# Patient Record
Sex: Female | Born: 1980 | Race: White | Hispanic: No | Marital: Single
Health system: Southern US, Community
[De-identification: ages and names within clinical notes are randomized; demographics above are authoritative.]

## PROBLEM LIST (undated history)

## (undated) DIAGNOSIS — D229 Melanocytic nevi, unspecified: Secondary | ICD-10-CM

## (undated) HISTORY — DX: Melanocytic nevi, unspecified: D22.9

---

## 2005-01-20 ENCOUNTER — Ambulatory Visit: Payer: Self-pay | Admitting: Family Medicine

## 2005-01-20 ENCOUNTER — Inpatient Hospital Stay: Payer: Self-pay | Admitting: Surgery

## 2011-10-06 ENCOUNTER — Ambulatory Visit: Payer: Self-pay | Admitting: Podiatry

## 2013-05-26 ENCOUNTER — Ambulatory Visit: Payer: Self-pay | Admitting: Unknown Physician Specialty

## 2014-09-30 DIAGNOSIS — Z30011 Encounter for initial prescription of contraceptive pills: Secondary | ICD-10-CM | POA: Insufficient documentation

## 2018-11-03 DIAGNOSIS — C4491 Basal cell carcinoma of skin, unspecified: Secondary | ICD-10-CM

## 2018-11-03 HISTORY — DX: Basal cell carcinoma of skin, unspecified: C44.91

## 2019-08-18 HISTORY — PX: BREAST BIOPSY: SHX20

## 2020-08-09 ENCOUNTER — Other Ambulatory Visit: Payer: Self-pay | Admitting: Family Medicine

## 2020-08-09 DIAGNOSIS — N6310 Unspecified lump in the right breast, unspecified quadrant: Secondary | ICD-10-CM

## 2020-08-12 ENCOUNTER — Other Ambulatory Visit: Payer: Self-pay

## 2020-08-12 ENCOUNTER — Ambulatory Visit
Admission: RE | Admit: 2020-08-12 | Discharge: 2020-08-12 | Disposition: A | Discharge status: Home / Self Care | Payer: Commercial Managed Care - PPO | Source: Ambulatory Visit | Attending: Family Medicine | Admitting: Family Medicine

## 2020-08-12 DIAGNOSIS — N6310 Unspecified lump in the right breast, unspecified quadrant: Secondary | ICD-10-CM

## 2020-08-16 ENCOUNTER — Other Ambulatory Visit: Payer: Self-pay | Admitting: Family Medicine

## 2020-08-16 DIAGNOSIS — N6001 Solitary cyst of right breast: Secondary | ICD-10-CM

## 2020-08-16 DIAGNOSIS — R928 Other abnormal and inconclusive findings on diagnostic imaging of breast: Secondary | ICD-10-CM

## 2020-08-17 ENCOUNTER — Other Ambulatory Visit: Payer: Self-pay

## 2020-08-17 ENCOUNTER — Ambulatory Visit
Admission: RE | Admit: 2020-08-17 | Discharge: 2020-08-17 | Disposition: A | Discharge status: Home / Self Care | Payer: Commercial Managed Care - PPO | Source: Ambulatory Visit | Attending: Family Medicine | Admitting: Family Medicine

## 2020-08-17 ENCOUNTER — Other Ambulatory Visit: Payer: Self-pay | Admitting: Family Medicine

## 2020-08-17 DIAGNOSIS — N6001 Solitary cyst of right breast: Secondary | ICD-10-CM

## 2020-08-17 DIAGNOSIS — R928 Other abnormal and inconclusive findings on diagnostic imaging of breast: Secondary | ICD-10-CM

## 2020-08-18 ENCOUNTER — Other Ambulatory Visit: Payer: Self-pay

## 2020-08-18 LAB — SURGICAL PATHOLOGY

## 2021-02-01 ENCOUNTER — Ambulatory Visit (INDEPENDENT_AMBULATORY_CARE_PROVIDER_SITE_OTHER): Payer: Commercial Managed Care - PPO

## 2021-02-01 ENCOUNTER — Other Ambulatory Visit: Payer: Self-pay | Admitting: Podiatry

## 2021-02-01 ENCOUNTER — Other Ambulatory Visit: Payer: Self-pay

## 2021-02-01 ENCOUNTER — Ambulatory Visit: Payer: Commercial Managed Care - PPO | Admitting: Podiatry

## 2021-02-01 DIAGNOSIS — M76822 Posterior tibial tendinitis, left leg: Secondary | ICD-10-CM | POA: Diagnosis not present

## 2021-02-01 DIAGNOSIS — N939 Abnormal uterine and vaginal bleeding, unspecified: Secondary | ICD-10-CM | POA: Insufficient documentation

## 2021-02-01 DIAGNOSIS — R1032 Left lower quadrant pain: Secondary | ICD-10-CM | POA: Insufficient documentation

## 2021-02-01 DIAGNOSIS — M7672 Peroneal tendinitis, left leg: Secondary | ICD-10-CM

## 2021-02-01 DIAGNOSIS — M778 Other enthesopathies, not elsewhere classified: Secondary | ICD-10-CM

## 2021-02-01 DIAGNOSIS — R102 Pelvic and perineal pain: Secondary | ICD-10-CM | POA: Insufficient documentation

## 2021-02-01 MED ORDER — MELOXICAM 15 MG PO TABS: 15.0000 mg | ORAL_TABLET | Freq: Every day | ORAL | 3 refills | Status: DC

## 2021-02-01 NOTE — Patient Instructions (Signed)
Posterior Tibial Tendinitis  Posterior tibial tendinitis is irritation of a tendon called the posterior tibial tendon. Your posterior tibial tendon is a cord-like tissue that connects bones of your lower leg and foot to a muscle that: 1. Supports your arch. 2. Helps you raise up on your toes. 3. Helps you turn your foot down and in. This condition causes foot and ankle pain. It can also lead to a flat foot. What are the causes? This condition is most often caused by repeated stress to the tendon (overuse injury). It can also be caused by a sudden injury that stresses the tendon, such as landing on your foot after jumping or falling. What increases the risk? This condition is more likely to develop in: 1. People who play a sport that involves putting a lot of pressure on the feet, such as: 1. Basketball. 2. Tennis. 3. Soccer. 4. Hockey. 2. Runners. 3. Females who are older than 40 years of age and are overweight. 4. People with diabetes. 5. People with decreased foot stability. 6. People with flat feet. What are the signs or symptoms? Symptoms include: 1. Pain in the inner ankle. 2. Pain at the arch of your foot. 3. Pain that gets worse with running, walking, or standing. 4. Swelling on the inside of your ankle and foot. 5. Weakness in your ankle or foot. 6. Inability to stand up on tiptoe. 7. Flattening of the arch of your foot. How is this diagnosed? This condition may be diagnosed based on: 1. Your symptoms. 2. Your medical history. 3. A physical exam. 4. Tests, such as: 1. X-ray. 2. MRI. 3. Ultrasound. How is this treated? This condition may be treated by: 1. Putting ice to the injured area. 2. Taking NSAIDs, such as ibuprofen, to reduce pain and swelling. 3. Wearing a special shoe or shoe insert to support your arch (orthotic). 4. Having physical therapy. 5. Replacing high-impact exercise with low-impact exercise, such as swimming or cycling. If your symptoms do not  improve with these treatments, you may need to wear a splint, removable walking boot, or short leg cast for 6-8 weeks to keep your foot and ankle still (immobilized). Follow these instructions at home: If you have a cast, splint, or boot:  Keep it clean and dry.  Check the skin around it every day. Tell your health care provider about any concerns. If you have a cast:  Do not stick anything inside it to scratch your skin. Doing that increases your risk of infection.  You may put lotion on dry skin around the edges of the cast. Do not put lotion on the skin underneath the cast. If you have a splint or boot:  Wear it as told by your health care provider. Remove it only as told by your health care provider.  Loosen it if your toes tingle, become numb, or turn cold and blue. Bathing 1. Do not take baths, swim, or use a hot tub until your health care provider approves. Ask your health care provider if you may take showers. 2. If your cast, splint, or boot is not waterproof: ? Do not let it get wet. ? Cover it with a waterproof covering while you take a bath or a shower. Managing pain and swelling   1. If directed, put ice on the injured area. ? If you have a removable splint or boot, remove it as told by your health care provider. ? Put ice in a plastic bag. ? Place a towel between your   skin and the bag or between your cast and the bag. ? Leave the ice on for 20 minutes, 2-3 times a day. 2. Move your toes often to reduce stiffness and swelling. 3. Raise (elevate) the injured area above the level of your heart while you are sitting or lying down. Activity  Do not use the injured foot to support your body weight until your health care provider says that you can. Use crutches as told by your health care provider.  Do not do activities that make pain or swelling worse.  Ask your health care provider when it is safe to drive if you have a cast, splint, or boot on your foot.  Return to  your normal activities as told by your health care provider. Ask your health care provider what activities are safe for you.  Do exercises as told by your health care provider. General instructions  Take over-the-counter and prescription medicines only as told by your health care provider.  If you have an orthotic, use it as told by your health care provider.  Keep all follow-up visits as told by your health care provider. This is important. How is this prevented?  Wear footwear that is appropriate to your athletic activity.  Avoid athletic activities that cause pain or swelling in your ankle or foot.  Before being active, do range-of-motion and stretching exercises.  If you develop pain or swelling while training, stop training.  If you have pain or swelling that does not improve after a few days of rest, see your health care provider.  If you start a new athletic activity, start gradually so you can build up your strength and flexibility. Contact a health care provider if:  Your symptoms get worse.  Your symptoms do not improve in 6-8 weeks.  You develop new, unexplained symptoms.  Your splint, boot, or cast gets damaged. Summary  Posterior tibial tendinitis is irritation of a tendon called the posterior tibial tendon.  This condition is most often caused by repeated stress to the tendon (overuse injury).  This condition causes foot pain and ankle pain. It can also lead to a flat foot.  This condition may be treated by not doing high-impact activities, applying ice, having physical therapy, wearing orthotics, and wearing a cast, splint, or boot if needed. This information is not intended to replace advice given to you by your health care provider. Make sure you discuss any questions you have with your health care provider. Document Revised: 12/09/2018 Document Reviewed: 10/16/2018 Elsevier Patient Education  2020 [employer]  Posterior Tibial Tendinitis Rehab Ask  your health care provider which exercises are safe for you. Do exercises exactly as told by your health care provider and adjust them as directed. It is normal to feel mild stretching, pulling, tightness, or discomfort as you do these exercises. Stop right away if you feel sudden pain or your pain gets worse. Do not begin these exercises until told by your health care provider. Stretching and range-of-motion exercises These exercises warm up your muscles and joints and improve the movement and flexibility in your ankle and foot. These exercises may also help to relieve pain. Standing wall calf stretch, knee straight   4. Stand with your hands against a wall. 5. Extend your left / right leg behind you, and bend your front knee slightly. If directed, place a folded washcloth under the arch of your foot for support. 6. Point the toes of your back foot slightly inward. 7. Keeping your heels   on the floor and your back knee straight, shift your weight toward the wall. Do not allow your back to arch. You should feel a gentle stretch in your upper left / right calf. 8. Hold this position for 10 seconds. Repeat 10 times. Complete this exercise 2 times a day. Standing wall calf stretch, knee bent 7. Stand with your hands against a wall. 8. Extend your left / right leg behind you, and bend your front knee slightly. If directed, place a folded washcloth under the arch of your foot for support. 9. Point the toes of your back foot slightly inward. 10. Unlock your back knee so it is bent. Keep your heels on the floor. You should feel a gentle stretch deep in your lower left / right calf. 11. Hold this position for 10 seconds. Repeat 10 times. Complete this exercise 2 times a day. Strengthening exercises These exercises build strength and endurance in your ankle and foot. Endurance is the ability to use your muscles for a long time, even after they get tired. Ankle inversion with band 8. Secure one end of a  rubber exercise band or tubing to a fixed object, such as a table leg or a pole, that will stay still when the band is pulled. 9. Loop the other end of the band around the middle of your left / right foot. 10. Sit on the floor facing the object with your left / right leg extended. The band or tube should be slightly tense when your foot is relaxed. 11. Leading with your big toe, slowly bring your left / right foot and ankle inward, toward your other foot (inversion). 12. Hold this position for 10 seconds. 13. Slowly return your foot to the starting position. Repeat 10 times. Complete this exercise 2 times a day. Towel curls   5. Sit in a chair on a non-carpeted surface, and put your feet on the floor. 6. Place a towel in front of your feet. 7. Keeping your heel on the floor, put your left / right foot on the towel. 8. Pull the towel toward you by grabbing the towel with your toes and curling them under. Keep your heel on the floor while you do this. 9. Let your toes relax. 10. Grab the towel with your toes again. Keep going until the towel is completely underneath your foot. Repeat 10 times. Complete this exercise 2 times a day. Balance exercise This exercise improves or maintains your balance. Balance is important in preventing falls. Single leg stand 6. Without wearing shoes, stand near a railing or in a doorway. You may hold on to the railing or door frame as needed for balance. 7. Stand on your left / right foot. Keep your big toe down on the floor and try to keep your arch lifted. ? If balancing in this position is too easy, try the exercise with your eyes closed or while standing on a pillow. 8. Hold this position for 10 seconds. Repeat 10 times. Complete this exercise 2 times a day. This information is not intended to replace advice given to you by your health care provider. Make sure you discuss any questions you have with your health care provider.  

## 2021-02-01 NOTE — Progress Notes (Signed)
  Subjective:  Patient ID: Debra Harper, female    DOB: 1981-01-19,  MRN: [id number]  Chief Complaint  Patient presents with   Foot Pain    Patient presents today for left foot pain on lateral side foot and top midfoot x 3-4 weeks. She says she noticed a painful knot on top of foot yesterday. And her toes are painful to bend and flex    40 y.o. female presents with the above complaint. History confirmed with patient.   Objective:  Physical Exam: warm, good capillary refill, no trophic changes or ulcerative lesions, normal DP and PT pulses, and normal sensory exam. PoP to 5th met base, dorsal midfoot, along PT tendon and with resisted eversion/inversion.   Radiographs: X-ray of the left foot: no fracture, dislocation, swelling or degenerative changes noted Assessment:   1. Peroneal tendinitis of left lower extremity   2. Posterior tibial tendinitis of left lower extremity      Plan:  Patient was evaluated and treated and all questions answered.  Discussed the etiology and treatment options for peroneal and posterior tibial tendinitis including stretching, formal physical therapy with an eccentric exercises therapy plan, supportive shoegears such as a running shoe or sneaker, heel lifts, topical and oral medications.  We also discussed that I do not routinely perform injections in this area because of the risk of an increased damage or rupture of the tendon.  We also discussed the role of surgical treatment of this for patients who do not improve after exhausting non-surgical treatment options.  -XR reviewed with patient -Educated on stretching and icing of the affected limb. -Rx for Meloxicam. Advised on risks, benefits, and alternatives of the medication   Return in about 1 month (around 03/03/2021) for re-check peroneal/PT tendinitis.

## 2021-03-06 ENCOUNTER — Other Ambulatory Visit: Payer: Self-pay

## 2021-03-06 ENCOUNTER — Encounter: Payer: Self-pay | Admitting: Podiatry

## 2021-03-06 ENCOUNTER — Ambulatory Visit: Payer: Commercial Managed Care - PPO | Admitting: Podiatry

## 2021-03-06 DIAGNOSIS — M84375A Stress fracture, left foot, initial encounter for fracture: Secondary | ICD-10-CM

## 2021-03-06 DIAGNOSIS — M76822 Posterior tibial tendinitis, left leg: Secondary | ICD-10-CM | POA: Diagnosis not present

## 2021-03-06 DIAGNOSIS — M7672 Peroneal tendinitis, left leg: Secondary | ICD-10-CM

## 2021-03-06 NOTE — Progress Notes (Signed)
  Subjective:  Patient ID: Debra Harper, female    DOB: Mar 06, 1981,  MRN: [id number]  Chief Complaint  Patient presents with   Tendonitis      PERONEAL/ PT TENDONITIS     40 y.o. female presents with the above complaint. History confirmed with patient.  Still has not improved.  She is having quite a bit of foot pain.  The Tri-Lock has made her pain worse.  Objective:  Physical Exam: warm, good capillary refill, no trophic changes or ulcerative lesions, normal DP and PT pulses, and normal sensory exam. PoP to 5th met base, dorsal midfoot, along PT tendon and with resisted eversion/inversion.   Radiographs: X-ray of the left foot: no fracture, dislocation, swelling or degenerative changes noted Assessment:   1. Peroneal tendinitis of left lower extremity   2. Posterior tibial tendinitis of left lower extremity   3. Stress reaction of left foot, initial encounter      Plan:  Patient was evaluated and treated and all questions answered.  Discussed the etiology and treatment options for peroneal and posterior tibial tendinitis including stretching, formal physical therapy with an eccentric exercises therapy plan, supportive shoegears such as a running shoe or sneaker, heel lifts, topical and oral medications.  We also discussed that I do not routinely perform injections in this area because of the risk of an increased damage or rupture of the tendon.  We also discussed the role of surgical treatment of this for patients who do not improve after exhausting non-surgical treatment options.  -Continue rest and stretching and therapy exercises -Recommended upgrading to a CAM boot today this was dispensed -Continue use of Voltaren gel which she has, she did not tolerate meloxicam well feels that she had a reaction, use OTC NSAIDs -MRI next visit if not improved  No follow-ups on file.

## 2021-03-22 ENCOUNTER — Telehealth: Payer: Self-pay | Admitting: Podiatry

## 2021-03-22 NOTE — Telephone Encounter (Signed)
Patient called this am stating she was given a boot and it is very uncomfortable. Pt states she doesn't think it fits correctly. Pt's phone is [phone].

## 2021-03-22 NOTE — Telephone Encounter (Signed)
She can come by the office to see if it is correct or if a different size would be better

## 2021-03-24 ENCOUNTER — Other Ambulatory Visit: Payer: Self-pay | Admitting: Podiatry

## 2021-03-24 ENCOUNTER — Telehealth: Payer: Self-pay

## 2021-03-24 MED ORDER — METHYLPREDNISOLONE 4 MG PO TBPK: ORAL_TABLET | ORAL | 0 refills | Status: DC

## 2021-03-24 NOTE — Progress Notes (Signed)
Patient came into the office today with problems with the CAM boot. Continues to experience pain despite the CAM. Rx Medrol Dose Pak.  RTC 2 weeks for f/u.   Felecia Shelling, DPM [facility]  Dr. Felecia Shelling, DPM    [address] [place], [place] [address]                Office [phone]  Fax [phone]

## 2021-03-24 NOTE — Telephone Encounter (Signed)
Error

## 2021-04-03 ENCOUNTER — Ambulatory Visit: Payer: Commercial Managed Care - PPO | Admitting: Podiatry

## 2021-04-04 ENCOUNTER — Ambulatory Visit: Payer: Commercial Managed Care - PPO | Admitting: Podiatry

## 2021-07-04 ENCOUNTER — Other Ambulatory Visit: Payer: Self-pay | Admitting: Obstetrics and Gynecology

## 2021-07-04 DIAGNOSIS — Z1231 Encounter for screening mammogram for malignant neoplasm of breast: Secondary | ICD-10-CM

## 2021-07-05 DIAGNOSIS — D039 Melanoma in situ, unspecified: Secondary | ICD-10-CM

## 2021-07-05 HISTORY — DX: Melanoma in situ, unspecified: D03.9

## 2021-09-20 DIAGNOSIS — M7742 Metatarsalgia, left foot: Secondary | ICD-10-CM | POA: Insufficient documentation

## 2021-09-20 DIAGNOSIS — M79672 Pain in left foot: Secondary | ICD-10-CM | POA: Insufficient documentation

## 2021-09-20 DIAGNOSIS — M722 Plantar fascial fibromatosis: Secondary | ICD-10-CM | POA: Insufficient documentation

## 2021-10-02 ENCOUNTER — Ambulatory Visit
Admission: RE | Admit: 2021-10-02 | Discharge: 2021-10-02 | Disposition: A | Discharge status: Home / Self Care | Payer: Commercial Managed Care - PPO | Source: Ambulatory Visit | Attending: Obstetrics and Gynecology | Admitting: Obstetrics and Gynecology

## 2021-10-02 ENCOUNTER — Other Ambulatory Visit: Payer: Self-pay

## 2021-10-02 DIAGNOSIS — Z1231 Encounter for screening mammogram for malignant neoplasm of breast: Secondary | ICD-10-CM | POA: Insufficient documentation

## 2021-10-17 ENCOUNTER — Ambulatory Visit (INDEPENDENT_AMBULATORY_CARE_PROVIDER_SITE_OTHER): Payer: Commercial Managed Care - PPO

## 2021-10-17 ENCOUNTER — Other Ambulatory Visit: Payer: Self-pay | Admitting: Podiatry

## 2021-10-17 ENCOUNTER — Ambulatory Visit: Payer: Commercial Managed Care - PPO | Admitting: Podiatry

## 2021-10-17 ENCOUNTER — Other Ambulatory Visit: Payer: Self-pay

## 2021-10-17 DIAGNOSIS — M76822 Posterior tibial tendinitis, left leg: Secondary | ICD-10-CM

## 2021-10-17 DIAGNOSIS — M7752 Other enthesopathy of left foot: Secondary | ICD-10-CM

## 2021-10-17 DIAGNOSIS — M778 Other enthesopathies, not elsewhere classified: Secondary | ICD-10-CM | POA: Diagnosis not present

## 2021-10-17 MED ORDER — MELOXICAM 15 MG PO TABS: 15.0000 mg | ORAL_TABLET | Freq: Every day | ORAL | 1 refills | Status: AC

## 2021-10-17 MED ORDER — BETAMETHASONE SOD PHOS & ACET 6 (3-3) MG/ML IJ SUSP
3.0000 mg | Freq: Once | INTRAMUSCULAR | Status: AC
  Administered 2021-10-17: 3 mg via INTRA_ARTICULAR

## 2021-10-17 MED ORDER — METHYLPREDNISOLONE 4 MG PO TBPK: ORAL_TABLET | ORAL | 0 refills | Status: AC
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
## 2021-11-07 ENCOUNTER — Other Ambulatory Visit: Payer: Self-pay

## 2021-11-07 ENCOUNTER — Ambulatory Visit: Payer: Commercial Managed Care - PPO | Admitting: Podiatry

## 2021-11-07 DIAGNOSIS — M76822 Posterior tibial tendinitis, left leg: Secondary | ICD-10-CM

## 2021-11-07 NOTE — Progress Notes (Signed)
? ?  HPI: 41 y.o. female presenting today for follow-up evaluation of insertional posterior tibial tendinitis to the left foot.  Patient states that she is doing much better.  She the injections and the prednisone helped significantly.  She only has some mild intermittent tenderness occasionally.  She presents for further treatment and evaluation ? ?No past medical history on file. ? ?Past Surgical History:  ?Procedure Laterality Date  ? BREAST BIOPSY Right 08/18/2019  ? U/S bx, ribbon marker, benign  ? ? ?Allergies  ?Allergen Reactions  ? Oseltamivir Other (See Comments)  ? ?  ?Physical Exam: ?General: The patient is alert and oriented x3 in no acute distress. ? ?Dermatology: Skin is warm, dry and supple bilateral lower extremities. Negative for open lesions or macerations. ? ?Vascular: Palpable pedal pulses bilaterally. Capillary refill within normal limits.  Negative for any significant edema or erythema ? ?Neurological: Light touch and protective threshold grossly intact ? ?Musculoskeletal Exam: No pedal deformities noted.  Negative for any significant tenderness to palpation along posterior tibial tendon as it inserts on the navicular tuberosity.  Significant improvement since last visit ? ? ?Assessment: ?1.  Insertional posterior tibial tendinitis left ? ? ?Plan of Care:  ?1. Patient evaluated.  ?2.  Continue plantar fascial brace to support the medial longitudinal arch of the foot as needed ?3.  Continue meloxicam as needed ?4.  Return to clinic as needed ? ?*Works at [employer] ?  ?  ?Felecia Shelling, DPM ?[facility] ? ?Dr. Felecia Shelling, DPM  ?  ?[address]                                        ?[place], [place] [address]                ?Office [phone]  ?Fax [phone] ? ? ? ? ?

## 2021-12-25 IMAGING — MG DIGITAL DIAGNOSTIC BILAT W/ TOMO W/ CAD
6 of 10 series · 6 of 30 positions shown · non-contrast
Comparison: None.

CLINICAL DATA: 39-year-old female presenting for evaluation of a
palpable lump in the right breast identified about 1 week ago.
Initially was tender and red. She was given a course of doxycycline,
and the erythema has improved.

EXAM:
DIGITAL DIAGNOSTIC BILATERAL MAMMOGRAM WITH TOMO AND CAD; ULTRASOUND
LEFT BREAST LIMITED; ULTRASOUND RIGHT BREAST LIMITED

[L MLO synth-2D]
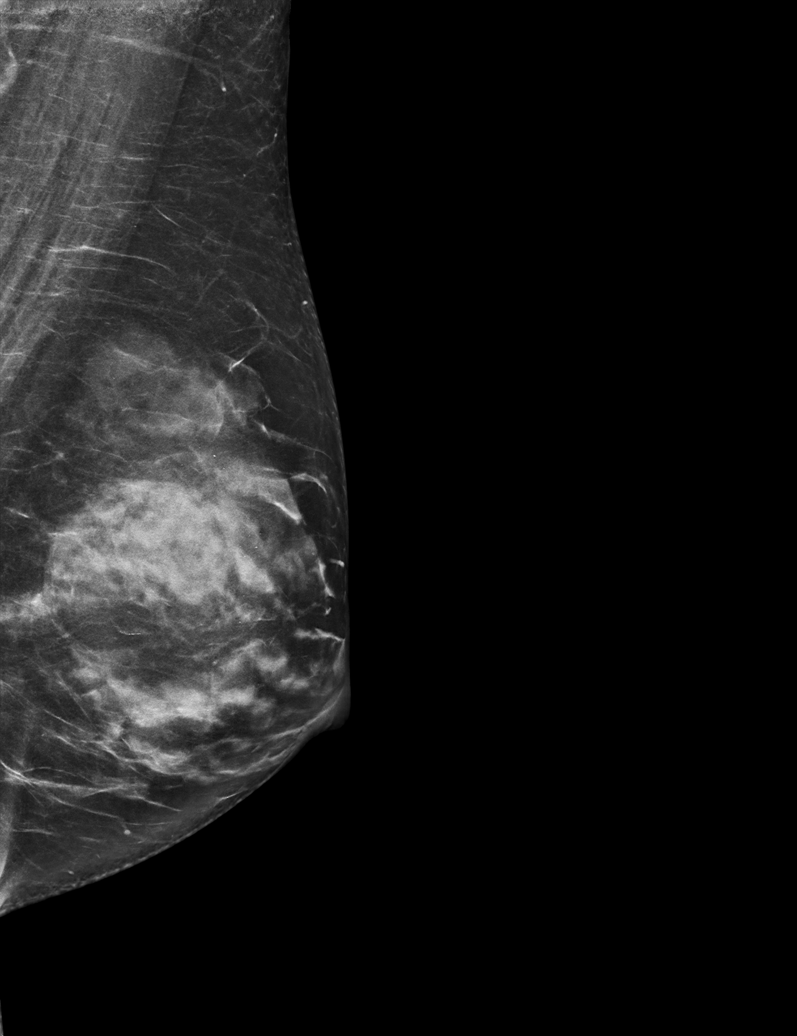

[R CC synth-2D]
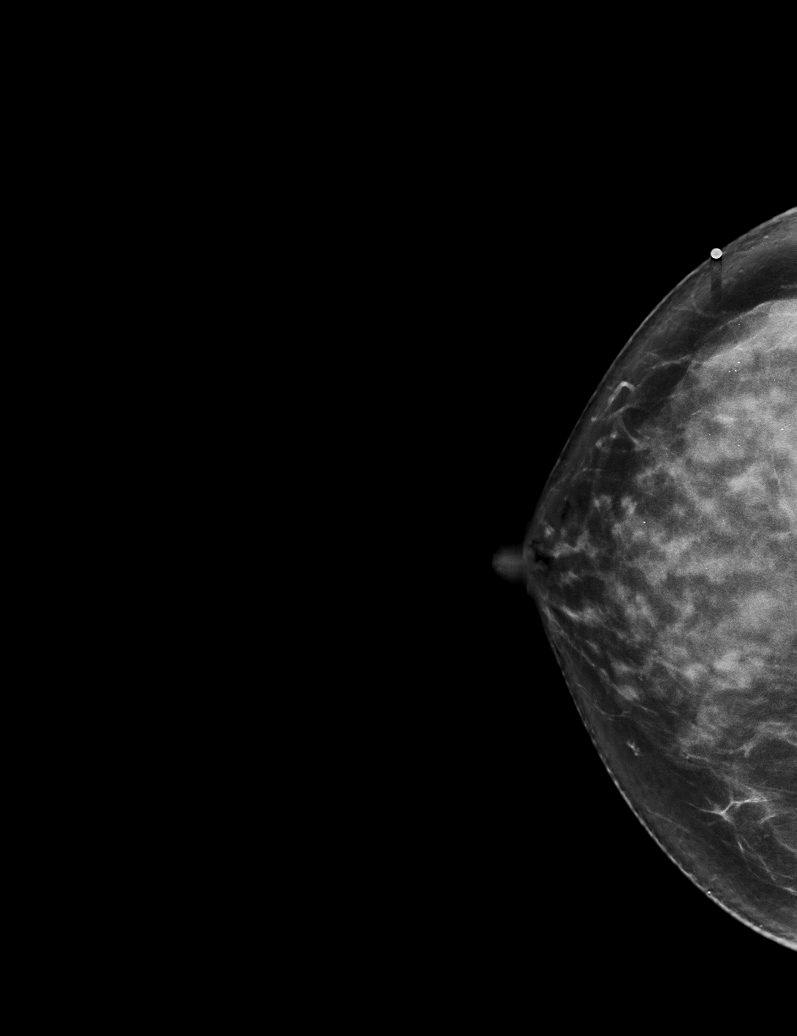

[R MLO synth-2D]
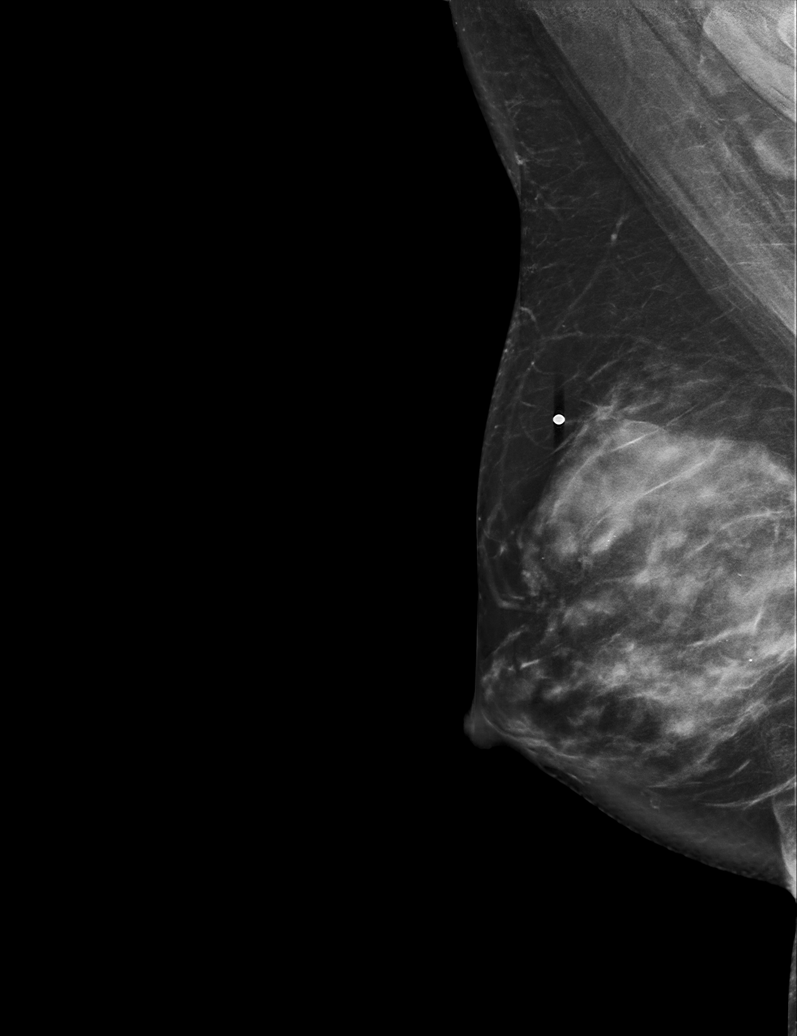

[R TAN synth-2D]
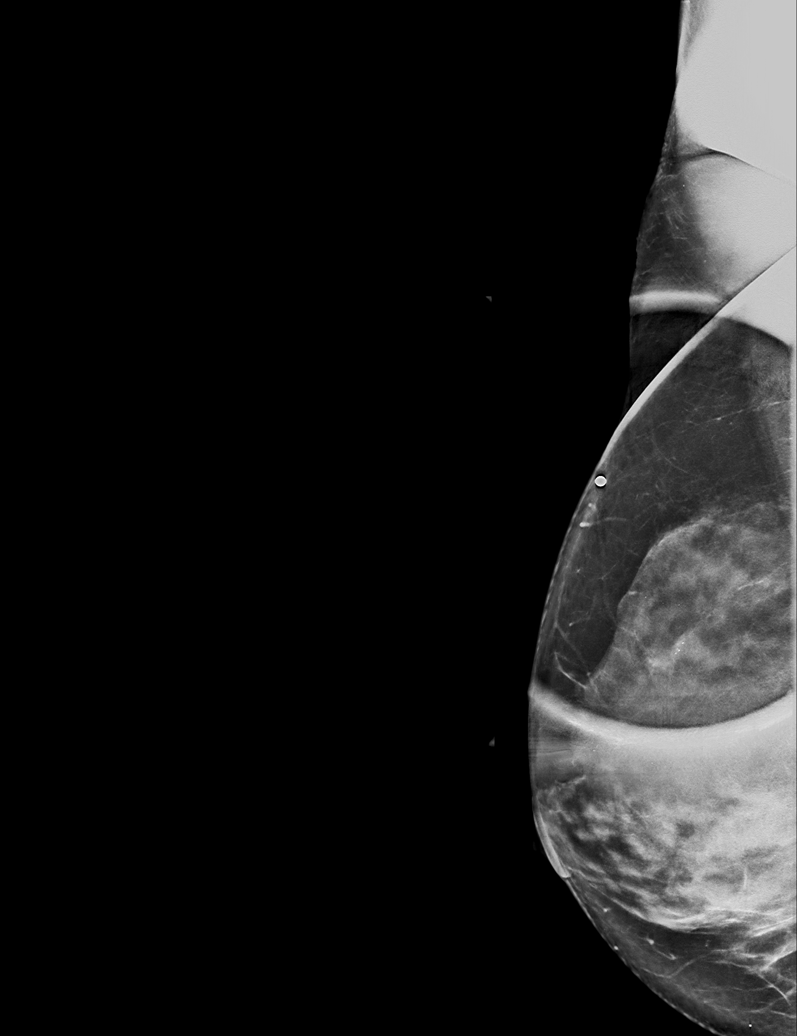

[L CC synth-2D]
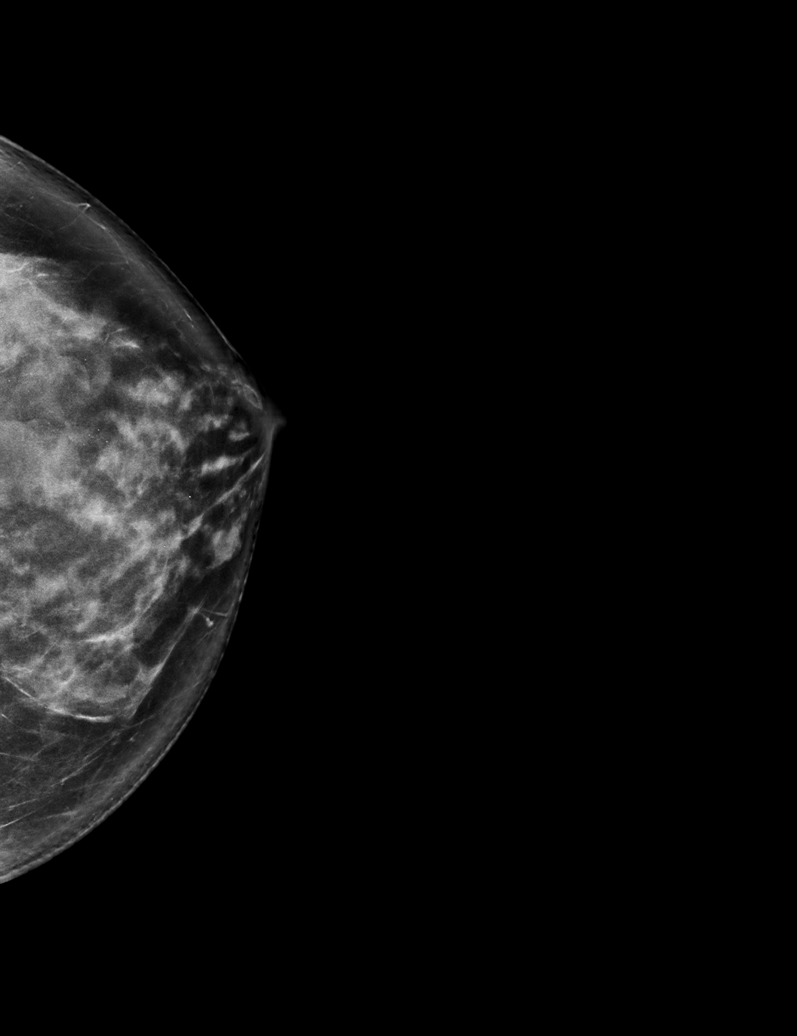

[R MLO tomo · tomo slice 40/79.0]
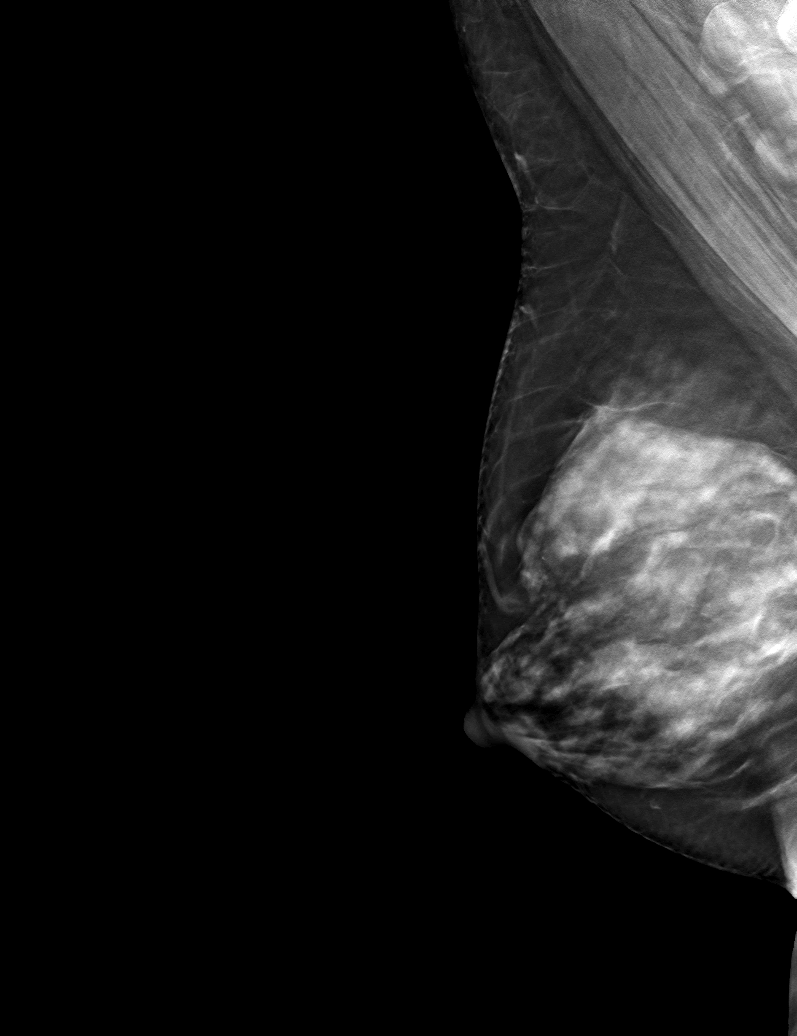

[6 of 30 positions shown; findings below may reference images not displayed]

ACR Breast Density Category d: The breast tissue is extremely dense,
which lowers the sensitivity of mammography.
FINDINGS: A BB indicating the palpable site of concern has been placed on the
upper-outer quadrant of the right breast. There is a possible
obscured mass in the region of the palpable marker. There may also
be an obscured mass in the lateral to lower outer posterior right
breast. In the superior to upper-outer left breast there are at
least 2 obscured oval masses.

Mammographic images were processed with CAD.

Ultrasound of the palpable site in the upper-outer right breast
demonstrates dense fibroglandular tissue. No suspicious masses or
areas of shadowing are identified.

Ultrasound of the right breast at 9 o'clock, 2 cm from the nipple
demonstrates a near anechoic oval mass with indistinct margins
measuring 2.3 cm in the transverse plane (the largest dimension).

Ultrasound of the left upper outer quadrant demonstrates numerous
anechoic oval circumscribed masses consistent with benign cysts. The
largest measures 2.4 cm.
IMPRESSION: 1. No suspicious mammographic or targeted sonographic abnormalities
are found at the palpable site in the upper-outer right breast.

2. There is a likely benign probable cyst in the right breast at 9
o'clock. Bilateral benign fibrocystic changes noted.

3.  No mammographic evidence of malignancy in the bilateral breasts.

RECOMMENDATION:
Options including aspiration and follow-up were discussed with the
patient. The patient prefers aspiration of the right breast cyst at
9 o'clock. We will call her to schedule the appointment at her
earliest convenience.

I have discussed the findings and recommendations with the patient.
If applicable, a reminder letter will be sent to the patient
regarding the next appointment.

BI-RADS CATEGORY  3: Probably benign.

## 2022-01-15 DIAGNOSIS — D239 Other benign neoplasm of skin, unspecified: Secondary | ICD-10-CM

## 2022-01-15 HISTORY — DX: Other benign neoplasm of skin, unspecified: D23.9

## 2022-10-17 ENCOUNTER — Other Ambulatory Visit: Payer: Self-pay

## 2022-10-17 DIAGNOSIS — Z1231 Encounter for screening mammogram for malignant neoplasm of breast: Secondary | ICD-10-CM

## 2022-10-25 ENCOUNTER — Ambulatory Visit
Admission: RE | Admit: 2022-10-25 | Discharge: 2022-10-25 | Disposition: A | Discharge status: Home / Self Care | Payer: Commercial Managed Care - PPO | Source: Ambulatory Visit | Attending: Internal Medicine | Admitting: Internal Medicine

## 2022-10-25 DIAGNOSIS — Z1231 Encounter for screening mammogram for malignant neoplasm of breast: Secondary | ICD-10-CM | POA: Diagnosis present

## 2023-09-19 ENCOUNTER — Other Ambulatory Visit: Payer: Self-pay | Admitting: Internal Medicine

## 2023-09-19 DIAGNOSIS — Z1231 Encounter for screening mammogram for malignant neoplasm of breast: Secondary | ICD-10-CM

## 2023-09-25 ENCOUNTER — Other Ambulatory Visit: Payer: Self-pay | Admitting: Internal Medicine

## 2023-09-25 DIAGNOSIS — R053 Chronic cough: Secondary | ICD-10-CM

## 2023-09-25 DIAGNOSIS — R0781 Pleurodynia: Secondary | ICD-10-CM

## 2023-09-26 ENCOUNTER — Other Ambulatory Visit
Admission: RE | Admit: 2023-09-26 | Discharge: 2023-09-26 | Disposition: A | Discharge status: Home / Self Care | Payer: Commercial Managed Care - PPO | Source: Ambulatory Visit | Attending: Physician Assistant | Admitting: Physician Assistant

## 2023-09-26 DIAGNOSIS — R0781 Pleurodynia: Secondary | ICD-10-CM | POA: Diagnosis present

## 2023-09-26 LAB — D-DIMER, QUANTITATIVE: D-Dimer, Quant: 0.43 ug{FEU}/mL (ref 0.00–0.50)

## 2024-04-01 ENCOUNTER — Encounter

## 2024-04-09 ENCOUNTER — Ambulatory Visit
Admission: RE | Admit: 2024-04-09 | Discharge: 2024-04-09 | Disposition: A | Discharge status: Home / Self Care | Source: Ambulatory Visit | Attending: Internal Medicine | Admitting: Internal Medicine

## 2024-04-09 DIAGNOSIS — Z1231 Encounter for screening mammogram for malignant neoplasm of breast: Secondary | ICD-10-CM | POA: Diagnosis present

## 2024-08-31 ENCOUNTER — Ambulatory Visit: Admitting: Physician Assistant

## 2024-08-31 ENCOUNTER — Encounter: Payer: Self-pay | Admitting: Physician Assistant

## 2024-08-31 VITALS — BP 151/92

## 2024-08-31 DIAGNOSIS — L821 Other seborrheic keratosis: Secondary | ICD-10-CM | POA: Diagnosis not present

## 2024-08-31 DIAGNOSIS — Z85820 Personal history of malignant melanoma of skin: Secondary | ICD-10-CM

## 2024-08-31 DIAGNOSIS — D2272 Melanocytic nevi of left lower limb, including hip: Secondary | ICD-10-CM | POA: Diagnosis not present

## 2024-08-31 DIAGNOSIS — D485 Neoplasm of uncertain behavior of skin: Secondary | ICD-10-CM

## 2024-08-31 DIAGNOSIS — L814 Other melanin hyperpigmentation: Secondary | ICD-10-CM

## 2024-08-31 DIAGNOSIS — D2262 Melanocytic nevi of left upper limb, including shoulder: Secondary | ICD-10-CM | POA: Diagnosis not present

## 2024-08-31 DIAGNOSIS — W908XXA Exposure to other nonionizing radiation, initial encounter: Secondary | ICD-10-CM | POA: Diagnosis not present

## 2024-08-31 DIAGNOSIS — Z1283 Encounter for screening for malignant neoplasm of skin: Secondary | ICD-10-CM

## 2024-08-31 DIAGNOSIS — D229 Melanocytic nevi, unspecified: Secondary | ICD-10-CM

## 2024-08-31 DIAGNOSIS — Z85828 Personal history of other malignant neoplasm of skin: Secondary | ICD-10-CM

## 2024-08-31 DIAGNOSIS — D1801 Hemangioma of skin and subcutaneous tissue: Secondary | ICD-10-CM

## 2024-08-31 DIAGNOSIS — L578 Other skin changes due to chronic exposure to nonionizing radiation: Secondary | ICD-10-CM

## 2024-08-31 HISTORY — DX: Neoplasm of uncertain behavior of skin: D48.5

## 2024-08-31 NOTE — Progress Notes (Signed)
 "  New Patient Visit   Subjective  Debra Harper is a 44 y.o. female NEW PATIENT who presents for the following: Skin Cancer Screening and Full Body Skin Exam - History of Melanoma x 5 (five). Locations of her melanomas are in a media image in her [facility] chart.   Also has history of several NMSC.   Prior patient at [facility] in [place], [place]. She is wishing to establish care here moving forward.   The patient presents for Total-Body Skin Exam (TBSE) for skin cancer screening and mole check. The patient has spots, moles and lesions to be evaluated, some may be new or changing and the patient may have concern these could be cancer.    The following portions of the chart were reviewed this encounter and updated as appropriate: medications, allergies, medical history  Review of Systems:  No other skin or systemic complaints except as noted in HPI or Assessment and Plan.  Objective  Well appearing patient in no apparent distress; mood and affect are within normal limits.  A full examination was performed including scalp, head, eyes, ears, nose, lips, neck, chest, axillae, abdomen, back, buttocks, bilateral upper extremities, bilateral lower extremities, hands, feet, fingers, toes, fingernails, and toenails. All findings within normal limits unless otherwise noted below.   NO PALPABLE CERVICAL, AXILLARY OR INGUINAL LYMPHADENOPATHY.   Relevant physical exam findings are noted in the Assessment and Plan.  Left Post Upper Arm 6 mm irregular brown macule  Left Lower Leg 7 mm irregular brown macule   Assessment & Plan   SKIN CANCER SCREENING PERFORMED TODAY.  ACTINIC DAMAGE - Chronic condition, secondary to cumulative UV/sun exposure - diffuse scaly erythematous macules with underlying dyspigmentation - Recommend daily broad spectrum sunscreen SPF 30+ to sun-exposed areas, reapply every 2 hours as needed.  - Staying in the shade or wearing long sleeves, sun glasses  (UVA+UVB protection) and wide brim hats (4-inch brim around the entire circumference of the hat) are also recommended for sun protection.  - Call for new or changing lesions.  LENTIGINES, SEBORRHEIC KERATOSES, HEMANGIOMAS - Benign normal skin lesions - Benign-appearing - Call for any changes  MELANOCYTIC NEVI - Tan-brown and/or pink-flesh-colored symmetric macules and papules - Benign appearing on exam today - Observation - Call clinic for new or changing moles - Recommend daily use of broad spectrum spf 30+ sunscreen to sun-exposed areas.    HISTORY OF NON-MELANOMA SKIN CANCER OF THE SKIN - No evidence of recurrence today - Recommend regular full body skin exams - Recommend daily broad spectrum sunscreen SPF 30+ to sun-exposed areas, reapply every 2 hours as needed.  - Call if any new or changing lesions are noted between office visits       NEOPLASM OF UNCERTAIN BEHAVIOR OF SKIN (2) Left Post Upper Arm - Epidermal / dermal shaving  Lesion diameter (cm):  0.6 Informed consent: discussed and consent obtained   Timeout: patient name, date of birth, surgical site, and procedure verified   Procedure prep:  Patient was prepped and draped in usual sterile fashion Prep type:  Isopropyl alcohol Anesthesia: the lesion was anesthetized in a standard fashion   Anesthetic:  1% lidocaine w/ epinephrine 1-100,000 buffered w/ 8.4% NaHCO3 Instrument used: flexible razor blade   Hemostasis achieved with: pressure, aluminum chloride and electrodesiccation   Outcome: patient tolerated procedure well   Post-procedure details: sterile dressing applied and wound care instructions given   Dressing type: bandage and petrolatum    Specimen 1 - Surgical  pathology Differential Diagnosis: Dysplastic nevus R/O MM  Check Margins: No Left Lower Leg - Epidermal / dermal shaving  Lesion diameter (cm):  0.7 Informed consent: discussed and consent obtained   Timeout: patient name, date of birth,  surgical site, and procedure verified   Procedure prep:  Patient was prepped and draped in usual sterile fashion Prep type:  Isopropyl alcohol Anesthesia: the lesion was anesthetized in a standard fashion   Anesthetic:  1% lidocaine w/ epinephrine 1-100,000 buffered w/ 8.4% NaHCO3 Instrument used: flexible razor blade   Hemostasis achieved with: pressure, aluminum chloride and electrodesiccation   Outcome: patient tolerated procedure well   Post-procedure details: sterile dressing applied and wound care instructions given   Dressing type: bandage and petrolatum    Specimen 2 - Surgical pathology Differential Diagnosis: Dysplastic nevus R/O MM  Check Margins: No PERSONAL HISTORY OF MALIGNANT MELANOMA OF SKIN   ACTINIC SKIN DAMAGE   CHERRY ANGIOMA   MULTIPLE BENIGN NEVI   SEBORRHEIC KERATOSIS   LENTIGINES   SCREENING EXAM FOR SKIN CANCER   HISTORY OF NONMELANOMA SKIN CANCER    Return in about 3 months (around 11/29/2024) for TBSE, History of Melanoma.  I, Roseline Hutchinson, CMA, am acting as scribe for Jamine Highfill K, PA-C .   Documentation: I have reviewed the above documentation for accuracy and completeness, and I agree with the above.  Nakai Yard K, PA-C    "

## 2024-08-31 NOTE — Patient Instructions (Addendum)

## 2024-09-02 ENCOUNTER — Ambulatory Visit: Payer: Self-pay | Admitting: Physician Assistant

## 2024-09-02 LAB — SURGICAL PATHOLOGY

## 2024-09-23 ENCOUNTER — Encounter: Payer: Self-pay | Admitting: Dermatology

## 2024-09-24 ENCOUNTER — Encounter: Payer: Self-pay | Admitting: Dermatology

## 2024-09-24 ENCOUNTER — Ambulatory Visit: Admitting: Dermatology

## 2024-09-24 VITALS — BP 135/86 | HR 86 | Temp 98.3°F

## 2024-09-24 DIAGNOSIS — D485 Neoplasm of uncertain behavior of skin: Secondary | ICD-10-CM

## 2024-09-24 NOTE — Progress Notes (Signed)
 "  Follow-Up Visit   Subjective  Debra Harper is a 44 y.o. female who presents for the following: Mohs for Atypical Intraepidermal Melanocytic Proliferation/cannot rule out Melanoma in Situ on the left posterior upper arm. Biopsy was performed on 08/31/2024 by Erminio Like PA-C.  Pt reports lesion has been present for about a month and denies previous treatment. Pt denies pain and itchiness.   The following portions of the chart were reviewed this encounter and updated as appropriate: medications, allergies, medical history  Review of Systems:  No other skin or systemic complaints except as noted in HPI or Assessment and Plan.  Objective  Well appearing patient in no apparent distress; mood and affect are within normal limits.  A focused examination was performed of the following areas: Left Post Upper Arm Relevant physical exam findings are noted in the Assessment and Plan.  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 No   Was the anticoagulation regimen changed prior to Mohs? No    Anesthesia: Anesthesia method: local infiltration Local anesthetic: lidocaine 1% WITH epi (14cc)  Procedure Details: Timeout: pre-procedure verification complete Procedure Prep: patient was prepped and draped in usual sterile fashion Prep type: chlorhexidine Biopsy accession number: [id number] Biopsy lab: [facility] Date of biopsy: 09/01/2023 Frozen section biopsy performed: No   Specimen debulked: No   Surgery side: left Surgical site (from skin exam): Left Upper Arm - Posterior Pre-operative length (cm): 1.3 Pre-operative width (cm): 1.2 Indications for Mohs surgery: tumor size greater than 2 cm Previously treated? No     Micrographic Surgery Details: Post-operative length (cm): 2.8 Post-operative width (cm): 2.5 Number of Mohs stages: 1 Post surgery depth of defect: subcutaneous fat  Stage 1    Tumor features identified on Mohs section: no tumor identified  Patient tolerance of procedure: tolerated well, no immediate complications  Reconstruction: Was the defect reconstructed? Yes   Was reconstruction performed by the same Mohs surgeon? Yes   Setting of reconstruction: outpatient office When was reconstruction performed? same day Type of reconstruction: linear  Opioids: Did the patient receive a prescription for opioid/narcotic related to Mohs surgery?: No   Indications for opioid/narcotics: no indications  Antibiotics: Does patient meet AHA guidelines for endocarditis?: No   Does patient meet AHA guidelines for orthopedic prophylaxis?: No   Were antibiotics given on the day of surgery?: No   Did surgery breach mucosa, expose cartilage/bone, involve an area of lymphedema/inflamed/infected tissue? No    - Skin repair Complexity:  Complex Final length (cm):  7 Informed consent: discussed and consent obtained   Timeout: patient name, date of birth, surgical site, and procedure verified   Procedure prep:  Patient was prepped and draped in usual sterile fashion Prep type:  Chlorhexidine Anesthesia: the lesion was anesthetized in a standard fashion   Anesthetic:  1% lidocaine w/ epinephrine 1-100,000 buffered w/ 8.4% NaHCO3 (16cc) Reason for type of repair: reduce the risk of dehiscence, infection, and necrosis, allow closure of the large defect, preserve normal anatomy and preserve normal anatomical and functional relationships   Undermining: area extensively undermined   Subcutaneous layers (deep stitches):  Suture size:  3-0 Suture type: PDS (polydioxanone)   Stitches:  Buried vertical mattress Fine/surface layer approximation (top stitches):  Suture type: cyanoacrylate tissue glue  Hemostasis achieved with: suture, pressure and electrodesiccation Outcome: patient tolerated procedure well with no complications   Post-procedure details: sterile dressing applied and wound care instructions given   Dressing type: pressure dressing and bandage (steri strips)      Return in about 4 weeks (around 10/22/2024) for Wound Check .  LILLETTE Virgle Boards, Dermatology Mohs Tech am acting as a scribe for RUFUS CHRISTELLA HOLY, MD.   09/24/2024  HISTORY OF PRESENT ILLNESS  Debra Harper is seen in consultation at the request of Erminio Like, PA-C for biopsy-proven Atypical Intraepidermal Melanocytic Proliferation/ cannot rule out Melanoma in Situ of the . They note that the area has been present for about 6 months increasing in size with time.  There is no history of previous treatment.  Reports no other new or changing lesions and has no other complaints today.  Medications and allergies: see patient chart.  Review of systems: Reviewed 8 systems and notable for the above skin cancer.  All other systems reviewed are unremarkable/negative, unless noted in the HPI. Past medical history, surgical history, family history, social history were also reviewed and are noted in the chart/questionnaire.    PHYSICAL EXAMINATION  General: Well-appearing, in no acute distress, alert and oriented x 4. Vitals reviewed in chart (if available).   Skin: Exam reveals a 1.3 x 1.2 cm erythematous papule and biopsy scar on the left posterior arm. There are rhytids, telangiectasias, and lentigines, consistent with photodamage.  Biopsy report(s) reviewed, confirming the diagnosis.   ASSESSMENT  1) Atypical Intraepidermal Melanocytic Proliferation/ cannot rule out melanoma in situ of the left posterior arm 2) photodamage 3) solar lentigines   PLAN   1. Due to location, size, histology, or recurrence and the likelihood of subclinical extension as well as the need to conserve normal surrounding tissue, the  patient was deemed acceptable for Mohs micrographic surgery (MMS).  The nature and purpose of the procedure, associated benefits and risks including recurrence and scarring, possible complications such as pain, infection, and bleeding, and alternative methods of treatment if appropriate were discussed with the patient during consent. The lesion location was verified by the patient, by reviewing previous notes, pathology reports, and by photographs as well as angulation measurements if available.  Informed consent was reviewed and signed by the patient, and timeout was performed at 7:30 AM. See op note below.  2. For the photodamage and solar lentigines, sun protection discussed/information given on OTC sunscreens, and we recommend continued regular follow-up with primary dermatologist every 6 months or sooner for any growing, bleeding, or changing lesions. 3. Prognosis and future surveillance discussed. 4. Letter with treatment outcome sent to referring provider. 5. Pain acetaminophen/ibuprofen  MOHS MICROGRAPHIC SURGERY AND RECONSTRUCTION  Initial size:   1.3 x 1.2 cm Surgical defect/wound size: 2.8 x 2.5 cm Anesthesia:    0.33% lidocaine with 1:200,000 epinephrine EBL:    <5 mL Complications:  None Repair type:   Complex SQ suture:   3-0 PDS Cutaneous suture:  Cyanoacrylate and Steristrips Final size of the repair: 7.0 cm  Stages: 1  STAGE I: Anesthesia achieved with 0.5% lidocaine with 1:200,000 epinephrine. ChloraPrep applied. 5 section(s) excised using Mohs technique (this includes total peripheral and deep tissue margin excision and evaluation with frozen sections, excised and interpreted by the same physician). The tumor was first debulked and then excised with an approx. 2mm margin.  Hemostasis was achieved with electrocautery as needed.  The specimen was then oriented, subdivided/relaxed, inked, and processed using Mohs technique.  Tissue was stained with H&E and MART-1 with 1  chromogen.   Frozen section analysis revealed a clear deep and peripheral margin.  Reconstruction  The surgical wound was then cleaned, prepped, and re-anesthetized as above. Wound edges were undermined extensively along at least one entire edge and at a distance equal to or greater than the width of the defect (see wound defect size above) in order to achieve closure and decrease wound tension and anatomic distortion. Redundant tissue repair including standing cone removal was performed. Hemostasis was achieved with electrocautery. Subcutaneous and epidermal tissues were approximated with the above sutures. The surgical site was then lightly scrubbed with sterile, saline-soaked gauze. Steri-strips were applied, and the area was then bandaged using Vaseline ointment, non-adherent gauze, gauze pads, and tape to provide an adequate pressure dressing. The patient tolerated the procedure well, was given detailed written and verbal wound care instructions, and was discharged in good condition.   The patient will follow-up: 4 weeks.    Documentation: I have reviewed the above documentation for accuracy and completeness, and I agree with the above.  RUFUS CHRISTELLA HOLY, MD  "

## 2024-09-24 NOTE — Patient Instructions (Addendum)
 Steri-Strip Wound Care Instructions Keep the bandage clean, dry, and in place for 48 hours. (72 hours if you take blood thinners). After removing the bandage: Leave the Steri-Strips in place Do not pull, tug, or rub the Steri-Strips You may shower with the Steri-Strips in place Gently clean the area with mild soap and water  Pat the area dry with a clean towel or cloth Do not remove the Steri-Strips. They will fall off on their own, usually within 10-14 days.  IN THE EVENT OF AN AFTER HOURS EMERGENCY (PAIN, BLEEDING, INFECTION), PLEASE CALL OUR OFFICE AND YOU BE DIRECTED TO THE ON CALL STAFF. MYCHART MESSAGES SENT AFTER HOURS WILL BE ADDRESSED ON THE NEXT BUSINESS DAY   What to Watch For After Surgery Bleeding Some light bleeding is normal in the first 24 hours. To lower bleeding risk: Do not lift more than 10 pounds for 1 week If surgery was on your face, head, or neck, do not bend or stoop for 72 hours If surgery was on an arm or leg, keep it raised as much as possible (Limit standing and walking if it was on the leg) If bleeding happens: Press firmly on the area for 30 minutes without looking If bleeding continues, press again for another 30 minutes Call the office if bleeding does not stop  Swelling and Bruising Swelling and bruising are normal. Use an ice pack for 15 minutes each hour during the first 1-2 days Wrap ice in a towel to keep bandages dry Keep the area raised when possible Use extra pillows if surgery was on the head or neck Raise arms or legs near heart level if surgery was there  Infection (Uncommon) Call the office if you notice: Increasing pain Redness or swelling Yellow drainage (pus) Symptoms starting several days after surgery  Pain Control Some pain is normal. Rest, ice, and elevation help You may take Tylenol  (acetaminophen ) and Ibuprofen (Advil/Motrin) Do not take aspirin for pain If you already take daily aspirin, continue it, but do not add  extra Safe option (if allowed): Take 2 ibuprofen (200 mg each) 3 hours later take 2 Tylenol  (325 mg each) Keep alternating every 3 hours as needed If you cannot take ibuprofen: Take Tylenol  650 mg every 4-6 hours EXAMPLE: Time Medicine Dose  6:00 AM Ibuprofen 400 mg (2 tablets of 200 mg)  9:00 AM Tylenol  650 mg (2 tablets of 325 mg)  12:00 PM Ibuprofen 400 mg  3:00 PM Tylenol  650 mg  6:00 PM Ibuprofen 400 mg  9:00 PM Tylenol  650 mg    Healing and Scar Questions When will my scar fade? It takes about 1 year for the scar to mature. Redness usually fades over time. Can my wound open? Yes, the area is weak for the first 6 months. Avoid pulling or stretching it. When will feeling return? Numbness or tingling can last 1-2 years. Some numbness may be permanent. Nose stuffiness If surgery was on your nose, stuffiness can last several months and will slowly improve. When can I stop wound care? You may stop once the skin is fully healed with no open areas. Makeup and sunscreen You may use makeup and sunscreen once: Stitches are removed, and The wound is fully healed Use sunscreen daily on healed skin.  Lumps, Puffiness, and Massage Small lumps under the skin are normal and will go away A small pimple along the scar can happen Use warm compresses Call if it does not improve Puffy scars can sometimes be treated -- call  the office if concerned After 1 month, gently massage the scar 2-3 times a day  Scar Products No Scar products should be applied until 1 month after your surgery Scar creams are optional Plain Vaseline works very well and is safe Stop any product that causes irritation  Future Skin Checks You may develop skin cancer again. See your dermatologist every 6-12 months Regular skin checks help find problems early  Important Information  Due to recent changes in healthcare laws, you may see results of your pathology and/or laboratory studies on MyChart before the  doctors have had a chance to review them. We understand that in some cases there may be results that are confusing or concerning to you. Please understand that not all results are received at the same time and often the doctors may need to interpret multiple results in order to provide you with the best plan of care or course of treatment. Therefore, we ask that you please give us  2 business days to thoroughly review all your results before contacting the office for clarification. Should we see a critical lab result, you will be contacted sooner.   If You Need Anything After Your Visit  If you have any questions or concerns for your doctor, please call our main line at [phone] If no one answers, please leave a voicemail as directed and we will return your call as soon as possible. Messages left after 4 pm will be answered the following business day.   You may also send us  a message via MyChart. We typically respond to MyChart messages within 1-2 business days.  For prescription refills, please ask your pharmacy to contact our office. Our fax number is [phone].  If you have an urgent issue when the clinic is closed that cannot wait until the next business day, you can page your doctor at the number below.    Please note that while we do our best to be available for urgent issues outside of office hours, we are not available 24/7.   If you have an urgent issue and are unable to reach us , you may choose to seek medical care at your doctor's office, retail clinic, urgent care center, or emergency room.  If you have a medical emergency, please immediately call 911 or go to the emergency department. In the event of inclement weather, please call our main line at [phone] for an update on the status of any delays or closures.  Dermatology Medication Tips: Please keep the boxes that topical medications come in in order to help keep track of the instructions about where and how to use  these. Pharmacies typically print the medication instructions only on the boxes and not directly on the medication tubes.   If your medication is too expensive, please contact our office at [phone] or send us  a message through MyChart.   We are unable to tell what your co-pay for medications will be in advance as this is different depending on your insurance coverage. However, we may be able to find a substitute medication at lower cost or fill out paperwork to get insurance to cover a needed medication.   If a prior authorization is required to get your medication covered by your insurance company, please allow us  1-2 business days to complete this process.  Drug prices often vary depending on where the prescription is filled and some pharmacies may offer cheaper prices.  The website www.goodrx.com contains coupons for medications through different pharmacies. The prices here do not  account for what the cost may be with help from insurance (it may be cheaper with your insurance), but the website can give you the price if you did not use any insurance.  - You can print the associated coupon and take it with your prescription to the pharmacy.  - You may also stop by our office during regular business hours and pick up a GoodRx coupon card.  - If you need your prescription sent electronically to a different pharmacy, notify our office through [facility] or by phone at [phone]

## 2024-09-28 ENCOUNTER — Encounter: Admitting: Dermatology

## 2024-10-28 ENCOUNTER — Ambulatory Visit: Admitting: Dermatology

## 2024-12-08 ENCOUNTER — Encounter: Admitting: Dermatology

## 2024-12-08 ENCOUNTER — Ambulatory Visit: Admitting: Physician Assistant
